# Patient Record
Sex: Male | Born: 1995 | Race: Black or African American | Hispanic: No | Marital: Single | Smoking: Never smoker
Health system: Southern US, Community
[De-identification: ages and names within clinical notes are randomized; demographics above are authoritative.]

## PROBLEM LIST (undated history)

## (undated) DIAGNOSIS — Z789 Other specified health status: Secondary | ICD-10-CM

## (undated) HISTORY — PX: NO PAST SURGERIES: SHX2092

---
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
## 2014-09-19 ENCOUNTER — Emergency Department (INDEPENDENT_AMBULATORY_CARE_PROVIDER_SITE_OTHER)
Admission: EM | Admit: 2014-09-19 | Discharge: 2014-09-19 | Disposition: A | Discharge status: Home / Self Care | Payer: Managed Care, Other (non HMO) | Source: Home / Self Care | Attending: Family Medicine | Admitting: Family Medicine

## 2014-09-19 ENCOUNTER — Encounter (HOSPITAL_COMMUNITY): Payer: Self-pay | Admitting: Emergency Medicine

## 2014-09-19 DIAGNOSIS — H6983 Other specified disorders of Eustachian tube, bilateral: Secondary | ICD-10-CM

## 2014-09-19 DIAGNOSIS — T700XXA Otitic barotrauma, initial encounter: Secondary | ICD-10-CM

## 2014-09-19 DIAGNOSIS — J029 Acute pharyngitis, unspecified: Secondary | ICD-10-CM

## 2014-09-19 LAB — POCT RAPID STREP A: Streptococcus, Group A Screen (Direct): NEGATIVE

## 2014-09-19 NOTE — ED Notes (Signed)
Sore throat and right ear pain, onset of symptoms 12/23

## 2014-09-19 NOTE — ED Provider Notes (Signed)
CSN: [id number]     Arrival date & time 09/19/14  1110 History   First MD Initiated Contact with Patient 09/19/14 1126     Chief Complaint  Patient presents with  . Sore Throat   (Consider location/radiation/quality/duration/timing/severity/associated sxs/prior Treatment) HPI Comments: C/O sore throat and right earache for 4 d.   History reviewed. No pertinent past medical history. History reviewed. No pertinent past surgical history. No family history on file. History  Substance Use Topics  . Smoking status: Never Smoker   . Smokeless tobacco: Not on file  . Alcohol Use: Yes    Review of Systems  Constitutional: Positive for activity change. Negative for fever.  HENT: Positive for congestion, postnasal drip, rhinorrhea and sore throat.   Respiratory: Positive for cough. Negative for shortness of breath.   Cardiovascular: Negative.   Gastrointestinal: Negative.     Allergies  Review of patient's allergies indicates no known allergies.  Home Medications   Prior to Admission medications   Medication Sig Start Date End Date Taking? Authorizing Provider  OVER THE COUNTER MEDICATION otc cough drops   Yes Historical Provider, MD   BP 117/76 mmHg  Pulse 74  Temp(Src) 98.7 F (37.1 C) (Oral)  Resp 16  SpO2 100% Physical Exam  Constitutional: He is oriented to person, place, and time. He appears well-developed and well-nourished. No distress.  HENT:  Mouth/Throat: No oropharyngeal exudate.  Bilat TM's retracted  OP with copious thick PND  Eyes: Conjunctivae and EOM are normal.  Neck: Normal range of motion. Neck supple.  Cardiovascular: Normal rate, regular rhythm and normal heart sounds.   Pulmonary/Chest: Effort normal and breath sounds normal.  Musculoskeletal: Normal range of motion.  Lymphadenopathy:    He has no cervical adenopathy.  Neurological: He is alert and oriented to person, place, and time.  Skin: Skin is warm and dry.  Psychiatric: He has a normal  mood and affect.  Nursing note and vitals reviewed.   ED Course  Procedures (including critical care time) Labs Review Labs Reviewed  POCT RAPID STREP A ([facility] ONLY)   Results for orders placed or performed during the hospital encounter of 09/19/14  POCT rapid strep A [facility])  Result Value Ref Range   Streptococcus, Group A Screen (Direct) NEGATIVE NEGATIVE    Imaging Review No results found.   MDM   1. Pharyngitis   2. Barotitis media, initial encounter   3. ETD (eustachian tube dysfunction), bilateral    Sudafed PE 10 mg  Robitussin Allegra or claritin for drainage Ibuprofen for pain Lots of fluids Nasal saline      Hayden Rasmussenavid Westin Knotts, NP 09/19/14 1148

## 2014-09-19 NOTE — Discharge Instructions (Signed)
Barotitis Media Sudafed PE 10 mg  Robitussin Allegra or claritin for drainage Ibuprofen for pain Lots of fluids Nasal saline Barotitis media is inflammation of your middle ear. This occurs when the auditory tube (eustachian tube) leading from the back of your nose (nasopharynx) to your eardrum is blocked. This blockage may result from a cold, environmental allergies, or an upper respiratory infection. Unresolved barotitis media may lead to damage or hearing loss (barotrauma), which may become permanent. HOME CARE INSTRUCTIONS   Use medicines as recommended by your health care provider. Over-the-counter medicines will help unblock the canal and can help during times of air travel.  Do not put anything into your ears to clean or unplug them. Eardrops will not be helpful.  Do not swim, dive, or fly until your health care provider says it is all right to do so. If these activities are necessary, chewing gum with frequent, forceful swallowing may help. It is also helpful to hold your nose and gently blow to pop your ears for equalizing pressure changes. This forces air into the eustachian tube.  Only take over-the-counter or prescription medicines for pain, discomfort, or fever as directed by your health care provider.  A decongestant may be helpful in decongesting the middle ear and make pressure equalization easier. SEEK MEDICAL CARE IF:  You experience a serious form of dizziness in which you feel as if the room is spinning and you feel nauseated (vertigo).  Your symptoms only involve one ear. SEEK IMMEDIATE MEDICAL CARE IF:   You develop a severe headache, dizziness, or severe ear pain.  You have bloody or pus-like drainage from your ears.  You develop a fever.  Your problems do not improve or become worse. MAKE SURE YOU:   Understand these instructions.  Will watch your condition.  Will get help right away if you are not doing well or get worse. Document Released: 09/07/2000  Document Revised: 07/01/2013 Document Reviewed: 04/07/2013 [facility] Patient Information 2015 [place], [place]. This information is not intended to replace advice given to you by your health care provider. Make sure you discuss any questions you have with your health care provider.  Sore Throat A sore throat is a painful, burning, sore, or scratchy feeling of the throat. There may be pain or tenderness when swallowing or talking. You may have other symptoms with a sore throat. These include coughing, sneezing, fever, or a swollen neck. A sore throat is often the first sign of another sickness. These sicknesses may include a cold, flu, strep throat, or an infection called mono. Most sore throats go away without medical treatment.  HOME CARE   Only take medicine as told by your doctor.  Drink enough fluids to keep your pee (urine) clear or pale yellow.  Rest as needed.  Try using throat sprays, lozenges, or suck on hard candy (if older than 4 years or as told).  Sip warm liquids, such as broth, herbal tea, or warm water with honey. Try sucking on frozen ice pops or drinking cold liquids.  Rinse the mouth (gargle) with salt water. Mix 1 teaspoon salt with 8 ounces of water.  Do not smoke. Avoid being around others when they are smoking.  Put a humidifier in your bedroom at night to moisten the air. You can also turn on a hot shower and sit in the bathroom for 5-10 minutes. Be sure the bathroom door is closed. GET HELP RIGHT AWAY IF:   You have trouble breathing.  You cannot swallow fluids,  soft foods, or your spit (saliva).  You have more puffiness (swelling) in the throat.  Your sore throat does not get better in 7 days.  You feel sick to your stomach (nauseous) and throw up (vomit).  You have a fever or lasting symptoms for more than 2-3 days.  You have a fever and your symptoms suddenly get worse. MAKE SURE YOU:   Understand these instructions.  Will watch your  condition.  Will get help right away if you are not doing well or get worse. Document Released: 06/19/2008 Document Revised: 06/04/2012 Document Reviewed: 05/18/2012 [facility] Patient Information 2015 [place], [place]. This information is not intended to replace advice given to you by your health care provider. Make sure you discuss any questions you have with your health care provider.  Upper Respiratory Infection, Adult An upper respiratory infection (URI) is also known as the common cold. It is often caused by a type of germ (virus). Colds are easily spread (contagious). You can pass it to others by kissing, coughing, sneezing, or drinking out of the same glass. Usually, you get better in 1 or 2 weeks.  HOME CARE   Only take medicine as told by your doctor.  Use a warm mist humidifier or breathe in steam from a hot shower.  Drink enough water and fluids to keep your pee (urine) clear or pale yellow.  Get plenty of rest.  Return to work when your temperature is back to normal or as told by your doctor. You may use a face mask and wash your hands to stop your cold from spreading. GET HELP RIGHT AWAY IF:   After the first few days, you feel you are getting worse.  You have questions about your medicine.  You have chills, shortness of breath, or brown or red spit (mucus).  You have yellow or brown snot (nasal discharge) or pain in the face, especially when you bend forward.  You have a fever, puffy (swollen) neck, pain when you swallow, or white spots in the back of your throat.  You have a bad headache, ear pain, sinus pain, or chest pain.  You have a high-pitched whistling sound when you breathe in and out (wheezing).  You have a lasting cough or cough up blood.  You have sore muscles or a stiff neck. MAKE SURE YOU:   Understand these instructions.  Will watch your condition.  Will get help right away if you are not doing well or get worse. Document Released: 02/27/2008  Document Revised: 12/03/2011 Document Reviewed: 12/16/2013 [facility] Patient Information 2015 [place], [place]. This information is not intended to replace advice given to you by your health care provider. Make sure you discuss any questions you have with your health care provider.

## 2014-09-21 LAB — CULTURE, GROUP A STREP

## 2020-06-27 NOTE — Progress Notes (Signed)
   06/28/2020 9:11 AM   Roc Ursin 07/04/1996 [id number]  Referring provider: No referring provider defined for this encounter. Chief Complaint  Patient presents with  . Circumcision    HPI: Jeremy Valenzuela is a 23 y.o. male who is seen today regarding discussion of circumcision.     The patient has been dealing with phimosis for years. He has reports pain at the tip of penis when retracting foreskin. During intercourse he feels like he will have a tear at the tip of his penis. He reports some cracking and bleeding after intercourse.  His foreskin is very redundant and at times difficult to retract due to the tightness.  He is interested in circumcision.  No risk factors including normal BMI, no diabetes.   PMH: No past medical history on file.  Surgical History: No past surgical history on file.  Home Medications:  Allergies as of 06/28/2020   No Known Allergies     Medication List       Accurate as of June 28, 2020  9:11 AM. If you have any questions, ask your nurse or doctor.        OVER THE COUNTER MEDICATION otc cough drops       Allergies: No Known Allergies  Family History: No family history on file.  Social History:  reports that he has never smoked. He does not have any smokeless tobacco history on file. He reports current alcohol use. He reports that he does not use drugs.   Physical Exam: BP 112/75 (BP Location: Left Arm, Patient Position: Sitting, Cuff Size: Normal)   Pulse 76   Ht 6' 1" (1.854 m)   Wt 152 lb (68.9 kg)   BMI 20.05 kg/m   Constitutional:  Alert and oriented, No acute distress. HEENT: [place] AT, moist mucus membranes.  Trachea midline, no masses. Cardiovascular: No clubbing, cyanosis, or edema. Respiratory: Normal respiratory effort, no increased work of breathing. GI: Abdomen is soft, nontender, nondistended, no abdominal masses GU: Uncircumcised phallus with tight foreskin, ultimately able to retract but a discrete band  just behind his coronal margin.  Normal glans with orthotopic patent meatus.  Normal bilateral descended testicles. Skin: No rashes, bruises or suspicious lesions. Neurologic: Grossly intact, no focal deficits, moving all 4 extremities. Psychiatric: Normal mood and affect.   Assessment & Plan:    1. Phimosis  I educated patient about circumcision procedure along with the risk of bleeding, dissatisfaction with cosmesis infection, wound healing, hematoma, sensitivity at the head of the penis, post op care and symptoms were all discussed in detail.     Patient is agreeable with plan.  [facility] [address], [address] [place], [place] [address] [phone]  I, Alexis Patterson, am acting as a scribe for Dr. Eytan Carrigan.  I have reviewed the above documentation for accuracy and completeness, and I agree with the above.   Therma Lasure, MD     

## 2020-06-27 NOTE — H&P (View-Only) (Signed)
   06/28/2020 9:11 AM   Jeremy Valenzuela 1996-07-13 [id number]  Referring provider: No referring provider defined for this encounter. Chief Complaint  Patient presents with  . Circumcision    HPI: Jeremy Valenzuela is a 24 y.o. male who is seen today regarding discussion of circumcision.     The patient has been dealing with phimosis for years. He has reports pain at the tip of penis when retracting foreskin. During intercourse he feels like he will have a tear at the tip of his penis. He reports some cracking and bleeding after intercourse.  His foreskin is very redundant and at times difficult to retract due to the tightness.  He is interested in circumcision.  No risk factors including normal BMI, no diabetes.   PMH: No past medical history on file.  Surgical History: No past surgical history on file.  Home Medications:  Allergies as of 06/28/2020   No Known Allergies     Medication List       Accurate as of June 28, 2020  9:11 AM. If you have any questions, ask your nurse or doctor.        OVER THE COUNTER MEDICATION otc cough drops       Allergies: No Known Allergies  Family History: No family history on file.  Social History:  reports that he has never smoked. He does not have any smokeless tobacco history on file. He reports current alcohol use. He reports that he does not use drugs.   Physical Exam: BP 112/75 (BP Location: Left Arm, Patient Position: Sitting, Cuff Size: Normal)   Pulse 76   Ht 6\' 1"  (1.854 m)   Wt 152 lb (68.9 kg)   BMI 20.05 kg/m   Constitutional:  Alert and oriented, No acute distress. HEENT: [place] AT, moist mucus membranes.  Trachea midline, no masses. Cardiovascular: No clubbing, cyanosis, or edema. Respiratory: Normal respiratory effort, no increased work of breathing. GI: Abdomen is soft, nontender, nondistended, no abdominal masses GU: Uncircumcised phallus with tight foreskin, ultimately able to retract but a discrete band  just behind his coronal margin.  Normal glans with orthotopic patent meatus.  Normal bilateral descended testicles. Skin: No rashes, bruises or suspicious lesions. Neurologic: Grossly intact, no focal deficits, moving all 4 extremities. Psychiatric: Normal mood and affect.   Assessment & Plan:    1. Phimosis  I educated patient about circumcision procedure along with the risk of bleeding, dissatisfaction with cosmesis infection, wound healing, hematoma, sensitivity at the head of the penis, post op care and symptoms were all discussed in detail.     Patient is agreeable with plan.  [facility] [address] [place], [place] [id number]  I, [phone], am acting as a scribe for Dr. Theador Hawthorne.  I have reviewed the above documentation for accuracy and completeness, and I agree with the above.   Vanna Scotland, MD

## 2020-06-28 ENCOUNTER — Ambulatory Visit (INDEPENDENT_AMBULATORY_CARE_PROVIDER_SITE_OTHER): Payer: Managed Care, Other (non HMO) | Admitting: Urology

## 2020-06-28 ENCOUNTER — Other Ambulatory Visit: Payer: Self-pay

## 2020-06-28 VITALS — BP 112/75 | HR 76 | Ht 73.0 in | Wt 152.0 lb

## 2020-06-28 DIAGNOSIS — N471 Phimosis: Secondary | ICD-10-CM

## 2020-07-01 ENCOUNTER — Other Ambulatory Visit: Payer: Self-pay | Admitting: Radiology

## 2020-07-04 ENCOUNTER — Other Ambulatory Visit: Payer: Self-pay | Admitting: Urology

## 2020-07-04 ENCOUNTER — Encounter
Admission: RE | Admit: 2020-07-04 | Discharge: 2020-07-04 | Disposition: A | Discharge status: Home / Self Care | Payer: Managed Care, Other (non HMO) | Source: Ambulatory Visit | Attending: Urology | Admitting: Urology

## 2020-07-04 ENCOUNTER — Other Ambulatory Visit: Payer: Self-pay | Admitting: Radiology

## 2020-07-04 ENCOUNTER — Other Ambulatory Visit: Payer: Self-pay

## 2020-07-04 DIAGNOSIS — N471 Phimosis: Secondary | ICD-10-CM

## 2020-07-04 HISTORY — DX: Other specified health status: Z78.9

## 2020-07-04 NOTE — Patient Instructions (Signed)
Your procedure is scheduled on: Thursday 07/07/20.  Report to DAY SURGERY DEPARTMENT LOCATED ON 2ND FLOOR MEDICAL MALL ENTRANCE. To find out your arrival time please call [phone] between 1PM - 3PM on Wednesday 07/06/20.   Remember: Instructions that are not followed completely may result in serious medical risk, up to and including death, or upon the discretion of your surgeon and anesthesiologist your surgery may need to be rescheduled.     __X__ 1. Do not eat food after midnight the night before your procedure.                 No gum chewing or hard candies. You may drink clear liquids up to 2 hours                 before you are scheduled to arrive for your surgery- DO NOT drink clear                 liquids within 2 hours of the start of your surgery.                 Clear Liquids include:  water, apple juice without pulp, clear carbohydrate                 drink such as Clearfast or Gatorade, Black Coffee or Tea (Do not add                 milk or creamer to coffee or tea).  __X__2.  On the morning of surgery brush your teeth with toothpaste and water, you may rinse your mouth with mouthwash if you wish.  Do not swallow any toothpaste or mouthwash.    __X__ 3.  No Alcohol for 24 hours before or after surgery.  __X__ 4.  Do Not Smoke or use e-cigarettes For 24 Hours Prior to Your Surgery.                 Do not use any chewable tobacco products for at least 6 hours prior to                 surgery.  __X__5.  Notify your doctor if there is any change in your medical condition      (cold, fever, infections).      Do NOT wear jewelry, make-up, hairpins, clips or nail polish. Do NOT wear lotions, powders, or perfumes.  Do NOT shave 48 hours prior to surgery. Men may shave face and neck. Do NOT bring valuables to the hospital.     [facility] is not responsible for any belongings or valuables.   Contacts, dentures/partials or body piercings may not be worn into surgery.  Bring a case for your contacts, glasses or hearing aids, a denture cup will be supplied.   Patients discharged the day of surgery will not be allowed to drive home.     __X__ Take these medicines the morning of surgery with A SIP OF WATER:     1. NONE    __X__ Shower before your arrival.  __X__ Stop Anti-inflammatories 7 days before surgery such as Advil, Ibuprofen, Motrin, BC or Goodies Powder, Naprosyn, Naproxen, Aleve, Aspirin, Meloxicam. May take Tylenol if needed for pain or discomfort.   __X__Do not start taking any new herbal supplements or vitamins prior to your procedure.    Wear comfortable clothing (specific to your surgery type) to the hospital.  Plan for stool softeners for home use; pain medications have a tendency to cause  constipation. You can also help prevent constipation by eating foods high in fiber such as fruits and vegetables and drinking plenty of fluids as your diet allows.  After surgery, you can prevent lung complications by doing breathing exercises.Take deep breaths and cough every 1-2 hours. Your doctor may order a device called an Incentive Spirometer to help you take deep breaths.  Please call the Pre-Admissions Testing Department at [phone] if you have any questions about these instructions.

## 2020-07-05 ENCOUNTER — Other Ambulatory Visit
Admission: RE | Admit: 2020-07-05 | Discharge: 2020-07-05 | Disposition: A | Discharge status: Home / Self Care | Payer: Managed Care, Other (non HMO) | Source: Ambulatory Visit | Attending: Urology | Admitting: Urology

## 2020-07-05 DIAGNOSIS — Z20822 Contact with and (suspected) exposure to covid-19: Secondary | ICD-10-CM | POA: Insufficient documentation

## 2020-07-05 DIAGNOSIS — Z01812 Encounter for preprocedural laboratory examination: Secondary | ICD-10-CM | POA: Insufficient documentation

## 2020-07-06 LAB — SARS CORONAVIRUS 2 (TAT 6-24 HRS): SARS Coronavirus 2: NEGATIVE

## 2020-07-07 ENCOUNTER — Ambulatory Visit: Payer: Managed Care, Other (non HMO) | Admitting: Anesthesiology

## 2020-07-07 ENCOUNTER — Ambulatory Visit
Admission: RE | Admit: 2020-07-07 | Discharge: 2020-07-07 | Disposition: A | Discharge status: Home / Self Care | Payer: Managed Care, Other (non HMO) | Attending: Urology | Admitting: Urology

## 2020-07-07 ENCOUNTER — Encounter: Payer: Self-pay | Admitting: Urology

## 2020-07-07 ENCOUNTER — Encounter
Admission: RE | Disposition: A | Discharge status: Home / Self Care | Payer: Self-pay | Source: Home / Self Care | Attending: Urology

## 2020-07-07 ENCOUNTER — Other Ambulatory Visit: Payer: Self-pay

## 2020-07-07 DIAGNOSIS — Q558 Other specified congenital malformations of male genital organs: Secondary | ICD-10-CM | POA: Insufficient documentation

## 2020-07-07 DIAGNOSIS — N471 Phimosis: Secondary | ICD-10-CM | POA: Insufficient documentation

## 2020-07-07 HISTORY — PX: CIRCUMCISION: SHX1350

## 2020-07-07 SURGERY — CIRCUMCISION, ADULT
Anesthesia: General

## 2020-07-07 MED ORDER — PROPOFOL 10 MG/ML IV BOLUS
INTRAVENOUS | Status: DC | PRN
  Administered 2020-07-07: 150 mg via INTRAVENOUS

## 2020-07-07 MED ORDER — EPHEDRINE 5 MG/ML INJ
INTRAVENOUS | Status: AC
  Filled 2020-07-07: qty 10

## 2020-07-07 MED ORDER — MIDAZOLAM HCL 2 MG/2ML IJ SOLN
INTRAMUSCULAR | Status: AC
  Filled 2020-07-07: qty 2

## 2020-07-07 MED ORDER — DEXAMETHASONE SODIUM PHOSPHATE 10 MG/ML IJ SOLN
INTRAMUSCULAR | Status: DC | PRN
  Administered 2020-07-07: 10 mg via INTRAVENOUS

## 2020-07-07 MED ORDER — ONDANSETRON HCL 4 MG/2ML IJ SOLN
INTRAMUSCULAR | Status: AC
  Filled 2020-07-07: qty 2

## 2020-07-07 MED ORDER — LACTATED RINGERS IV SOLN: INTRAVENOUS | Status: DC

## 2020-07-07 MED ORDER — DEXAMETHASONE SODIUM PHOSPHATE 10 MG/ML IJ SOLN
INTRAMUSCULAR | Status: AC
  Filled 2020-07-07: qty 1

## 2020-07-07 MED ORDER — FENTANYL CITRATE (PF) 100 MCG/2ML IJ SOLN: 25.0000 ug | INTRAMUSCULAR | Status: DC | PRN

## 2020-07-07 MED ORDER — EPHEDRINE SULFATE 50 MG/ML IJ SOLN
INTRAMUSCULAR | Status: DC | PRN
  Administered 2020-07-07 (×2): 10 mg via INTRAVENOUS

## 2020-07-07 MED ORDER — CHLORHEXIDINE GLUCONATE 0.12 % MT SOLN: 15.0000 mL | Freq: Once | OROMUCOSAL | Status: AC

## 2020-07-07 MED ORDER — LIDOCAINE HCL (PF) 1 % IJ SOLN
INTRAMUSCULAR | Status: AC
  Filled 2020-07-07: qty 30

## 2020-07-07 MED ORDER — FENTANYL CITRATE (PF) 100 MCG/2ML IJ SOLN
INTRAMUSCULAR | Status: DC | PRN
Start: 2020-07-07 — End: 2020-07-07
  Administered 2020-07-07 (×2): 25 ug via INTRAVENOUS

## 2020-07-07 MED ORDER — BACITRACIN ZINC 500 UNIT/GM EX OINT
TOPICAL_OINTMENT | CUTANEOUS | Status: DC | PRN
  Administered 2020-07-07: 1 via TOPICAL

## 2020-07-07 MED ORDER — SEVOFLURANE IN SOLN
RESPIRATORY_TRACT | Status: AC
  Filled 2020-07-07: qty 250

## 2020-07-07 MED ORDER — FAMOTIDINE 20 MG PO TABS
ORAL_TABLET | ORAL | Status: AC
  Administered 2020-07-07: 20 mg via ORAL
  Filled 2020-07-07: qty 1

## 2020-07-07 MED ORDER — LIDOCAINE HCL (PF) 2 % IJ SOLN
INTRAMUSCULAR | Status: AC
  Filled 2020-07-07: qty 5

## 2020-07-07 MED ORDER — FENTANYL CITRATE (PF) 100 MCG/2ML IJ SOLN
INTRAMUSCULAR | Status: AC
  Filled 2020-07-07: qty 2

## 2020-07-07 MED ORDER — FAMOTIDINE 20 MG PO TABS: 20.0000 mg | ORAL_TABLET | Freq: Once | ORAL | Status: AC

## 2020-07-07 MED ORDER — CEFAZOLIN SODIUM-DEXTROSE 1-4 GM/50ML-% IV SOLN
1.0000 g | INTRAVENOUS | Status: AC
  Administered 2020-07-07: 1 g via INTRAVENOUS

## 2020-07-07 MED ORDER — LIDOCAINE HCL (CARDIAC) PF 100 MG/5ML IV SOSY
PREFILLED_SYRINGE | INTRAVENOUS | Status: DC | PRN
  Administered 2020-07-07: 60 mg via INTRAVENOUS

## 2020-07-07 MED ORDER — MIDAZOLAM HCL 2 MG/2ML IJ SOLN
INTRAMUSCULAR | Status: DC | PRN
  Administered 2020-07-07: 2 mg via INTRAVENOUS

## 2020-07-07 MED ORDER — CHLORHEXIDINE GLUCONATE 0.12 % MT SOLN
OROMUCOSAL | Status: AC
  Administered 2020-07-07: 15 mL via OROMUCOSAL
  Filled 2020-07-07: qty 15

## 2020-07-07 MED ORDER — DOCUSATE SODIUM 100 MG PO CAPS: 100.0000 mg | ORAL_CAPSULE | Freq: Two times a day (BID) | ORAL | 0 refills | Status: AC

## 2020-07-07 MED ORDER — LIDOCAINE 1% INJECTION FOR CIRCUMCISION
INJECTION | INTRAVENOUS | Status: DC | PRN
  Administered 2020-07-07: 20 mL via SUBCUTANEOUS

## 2020-07-07 MED ORDER — OXYCODONE-ACETAMINOPHEN 5-325 MG PO TABS
1.0000 | ORAL_TABLET | ORAL | 0 refills | Status: AC | PRN
Start: 2020-07-07 — End: ?

## 2020-07-07 MED ORDER — PROMETHAZINE HCL 25 MG/ML IJ SOLN: 6.2500 mg | INTRAMUSCULAR | Status: DC | PRN

## 2020-07-07 MED ORDER — PROPOFOL 10 MG/ML IV BOLUS
INTRAVENOUS | Status: AC
  Filled 2020-07-07: qty 20

## 2020-07-07 MED ORDER — ORAL CARE MOUTH RINSE: 15.0000 mL | Freq: Once | OROMUCOSAL | Status: AC

## 2020-07-07 MED ORDER — CEFAZOLIN SODIUM-DEXTROSE 2-4 GM/100ML-% IV SOLN
INTRAVENOUS | Status: AC
  Filled 2020-07-07: qty 100

## 2020-07-07 SURGICAL SUPPLY — 26 items
APL PRP STRL LF DISP 70% ISPRP (MISCELLANEOUS) ×2
BLADE CLIPPER SURG (BLADE) ×3 IMPLANT
BLADE SURG 15 STRL LF DISP TIS (BLADE) ×1 IMPLANT
BLADE SURG 15 STRL SS (BLADE) ×3
BNDG COHESIVE 1X5 TAN NS LF (GAUZE/BANDAGES/DRESSINGS) ×3 IMPLANT
BNDG CONFORM 2 STRL LF (GAUZE/BANDAGES/DRESSINGS) ×3 IMPLANT
CANISTER SUCT 1200ML W/VALVE (MISCELLANEOUS) ×3 IMPLANT
CHLORAPREP W/TINT 26 (MISCELLANEOUS) ×6 IMPLANT
COVER WAND RF STERILE (DRAPES) ×3 IMPLANT
DRAPE LAPAROTOMY 77X122 PED (DRAPES) ×3 IMPLANT
ELECT REM PT RETURN 9FT ADLT (ELECTROSURGICAL) ×3
ELECTRODE REM PT RTRN 9FT ADLT (ELECTROSURGICAL) ×1 IMPLANT
GAUZE PETROLATUM 1 X8 (GAUZE/BANDAGES/DRESSINGS) ×3 IMPLANT
GLOVE BIO SURGEON STRL SZ 6.5 (GLOVE) ×8 IMPLANT
GLOVE BIO SURGEONS STRL SZ 6.5 (GLOVE) ×4
GOWN STRL REUS W/ TWL LRG LVL3 (GOWN DISPOSABLE) ×2 IMPLANT
GOWN STRL REUS W/TWL LRG LVL3 (GOWN DISPOSABLE) ×6
KIT TURNOVER KIT A (KITS) ×3 IMPLANT
LABEL OR SOLS (LABEL) ×3 IMPLANT
NEEDLE HYPO 25X1 1.5 SAFETY (NEEDLE) ×3 IMPLANT
NS IRRIG 500ML POUR BTL (IV SOLUTION) ×3 IMPLANT
PACK BASIN MINOR (MISCELLANEOUS) ×3 IMPLANT
SOL PREP PVP 2OZ (MISCELLANEOUS) ×3
SOLUTION PREP PVP 2OZ (MISCELLANEOUS) ×1 IMPLANT
SUT CHROMIC 3 0 SH 27 (SUTURE) ×6 IMPLANT
SYR 10ML LL (SYRINGE) ×3 IMPLANT

## 2020-07-07 NOTE — Op Note (Signed)
Date of procedure: 07/07/20  Preoperative diagnosis:  1. Phimosis 2. Frenular adhesion  Postoperative diagnosis:  1. Same as above  Procedure: 1. Circumcision       2.   Lysis of frenular adhesions/frenulectomy   Surgeon: Vanna Scotland, MD  Anesthesia: General  Complications: None  Intraoperative findings: Fairly significant phimosis with tight circumferential band of the foreskin.  Frenular attachments taken down sharply and oversewn.  Excellent post procedure cosmesis.  EBL: Minimal  Specimens: None  Drains: None  Indication: Jeremy Valenzuela is a 24 y.o. patient with bothersome phimosis.  After reviewing the management options for treatment, he elected to proceed with the above surgical procedure(s). We have discussed the potential benefits and risks of the procedure, side effects of the proposed treatment, the likelihood of the patient achieving the goals of the procedure, and any potential problems that might occur during the procedure or recuperation. Informed consent has been obtained.  Description of procedure:  The patient was taken to the operating room and general anesthesia was induced.  The patient was placed in the supine position, prepped and draped in the usual sterile fashion, and preoperative antibiotics were administered. A preoperative time-out was performed.   A penile block was performed injecting 10 cc chest superior to the base of the penis and Alcock's canal bilaterally.  An additional 10 cc of 1% lidocaine was used circumferentially around the base of the penis to perform a ring block.  Next, 2 circumferential lines were drawn, all 1 at the level of the midshaft and 1 approximately 1 cm proximal to the coronal margin.  Notably, upon reducing the foreskin, there was a very tight band of the foreskin which was somewhat difficult to retract.  I used additional Betadine solution to cleanse the glans which had not been previously fully exposed due to the tightness  of the foreskin.  Additional very dense attachments of the frenulum were noted forming a penile band at this location.  I crush this tissue using a small clamp and then used Metzenbaum scissors to take down these attachments.  The frenulum was then reconstructed using a series of interrupted 3-0 chromic sutures for hemostasis.  The cosmesis was excellent as well as hemostasis.  Next, the penile skin was incised at the planned marked sites.  The foreskin was then removed in a sleevelike fashion.  Adequate careful hemostasis was then achieved using Bovie electrocautery.  The foreskin was then reapproximated using running 3-0 chromic suture in interrupted fashion.  In the end, hemostasis and cosmesis was excellent.  The patient was then cleaned and dried.  A dressing of Vaseline gauze conformer and then Coban was applied.  Additional bacitracin bacitracin ointment was applied at the frenulum and on the glans.  He was then cleaned and dried, repositioned supine position, reversed myesthesia, taken to PACU in stable condition.  Plan: He will keep his bandage for 2 days.  We will have him follow-up in 4 weeks for wound check.  Vanna Scotland, M.D.

## 2020-07-07 NOTE — Anesthesia Procedure Notes (Signed)
Procedure Name: LMA Insertion Date/Time: 07/07/2020 12:34 PM Performed by: Omer Jack, CRNA Pre-anesthesia Checklist: Patient identified, Patient being monitored, Timeout performed, Emergency Drugs available and Suction available Patient Re-evaluated:Patient Re-evaluated prior to induction Oxygen Delivery Method: Circle system utilized Preoxygenation: Pre-oxygenation with 100% oxygen Induction Type: IV induction Ventilation: Mask ventilation without difficulty LMA: LMA inserted LMA Size: 4.0 Tube type: Oral Number of attempts: 1 Placement Confirmation: positive ETCO2 and breath sounds checked- equal and bilateral Tube secured with: Tape Dental Injury: Teeth and Oropharynx as per pre-operative assessment

## 2020-07-07 NOTE — Interval H&P Note (Signed)
History and Physical Interval Note:  07/07/2020 11:57 AM  [name]  has presented today for surgery, with the diagnosis of Phimosis.  The various methods of treatment have been discussed with the patient and family. After consideration of risks, benefits and other options for treatment, the patient has consented to  Procedure(s): CIRCUMCISION ADULT (N/A) as a surgical intervention.  The patient's history has been reviewed, patient examined, no change in status, stable for surgery.  I have reviewed the patient's chart and labs.  Questions were answered to the patient's satisfaction.    RR[facility] CTAB  Jeremy Valenzuela

## 2020-07-07 NOTE — Anesthesia Postprocedure Evaluation (Signed)
Anesthesia Post Note  Patient: Jeremy Valenzuela  Procedure(s) Performed: CIRCUMCISION ADULT (N/A )  Patient location during evaluation: PACU Anesthesia Type: General Level of consciousness: awake and alert Pain management: pain level controlled Vital Signs Assessment: post-procedure vital signs reviewed and stable Respiratory status: spontaneous breathing, nonlabored ventilation, respiratory function stable and patient connected to nasal cannula oxygen Cardiovascular status: blood pressure returned to baseline and stable Postop Assessment: no apparent nausea or vomiting Anesthetic complications: no   No complications documented.   Last Vitals:  Vitals:   07/07/20 1410 07/07/20 1431  BP: 121/78 (!) 102/52  Pulse: 98 70  Resp: 18 18  Temp: 36.4 C (!) 35.7 C  SpO2: 100% 100%    Last Pain:  Vitals:   07/07/20 1431  TempSrc: Tympanic  PainSc: 0-No pain                 Lenard Simmer

## 2020-07-07 NOTE — Anesthesia Preprocedure Evaluation (Signed)
Anesthesia Evaluation  Patient identified by MRN, date of birth, ID band Patient awake    Reviewed: Allergy & Precautions, H&P , NPO status , Patient's Chart, lab work & pertinent test results, reviewed documented beta blocker date and time   History of Anesthesia Complications Negative for: history of anesthetic complications  Airway Mallampati: II  TM Distance: >3 FB Neck ROM: full    Dental  (+) Dental Advidsory Given, Teeth Intact   Pulmonary neg pulmonary ROS,    Pulmonary exam normal breath sounds clear to auscultation       Cardiovascular Exercise Tolerance: Good negative cardio ROS Normal cardiovascular exam Rhythm:regular Rate:Normal     Neuro/Psych negative neurological ROS  negative psych ROS   GI/Hepatic negative GI ROS, Neg liver ROS,   Endo/Other  negative endocrine ROS  Renal/GU negative Renal ROS  negative genitourinary   Musculoskeletal   Abdominal   Peds  Hematology negative hematology ROS (+)   Anesthesia Other Findings Past Medical History: No date: Medical history non-contributory   Reproductive/Obstetrics negative OB ROS                             Anesthesia Physical Anesthesia Plan  ASA: I  Anesthesia Plan: General   Post-op Pain Management:    Induction: Intravenous  PONV Risk Score and Plan: 2 and Ondansetron, Dexamethasone, Midazolam, Promethazine and Treatment may vary due to age or medical condition  Airway Management Planned: LMA  Additional Equipment:   Intra-op Plan:   Post-operative Plan: Extubation in OR  Informed Consent: I have reviewed the patients History and Physical, chart, labs and discussed the procedure including the risks, benefits and alternatives for the proposed anesthesia with the patient or authorized representative who has indicated his/her understanding and acceptance.     Dental Advisory Given  Plan Discussed with:  Anesthesiologist, CRNA and Surgeon  Anesthesia Plan Comments:         Anesthesia Quick Evaluation

## 2020-07-07 NOTE — Transfer of Care (Signed)
Immediate Anesthesia Transfer of Care Note  Patient: Jeremy Valenzuela  Procedure(s) Performed: CIRCUMCISION ADULT (N/A )  Patient Location: PACU  Anesthesia Type:General  Level of Consciousness: awake and sedated  Airway & Oxygen Therapy: Patient Spontanous Breathing and Patient connected to face mask oxygen  Post-op Assessment: Report given to RN and Post -op Vital signs reviewed and stable  Post vital signs: Reviewed and stable  Last Vitals:  Vitals Value Taken Time  BP 114/73 07/07/20 1342  Temp    Pulse 68 07/07/20 1343  Resp 18 07/07/20 1343  SpO2 100 % 07/07/20 1343  Vitals shown include unvalidated device data.  Last Pain:  Vitals:   07/07/20 1059  TempSrc: Temporal  PainSc: 0-No pain         Complications: No complications documented.

## 2020-07-07 NOTE — Discharge Instructions (Signed)
Circumcision, Adult, Care After This sheet gives you information about how to care for yourself after your procedure. Your doctor may also give you more specific instructions. If you have problems or questions, contact your doctor. Follow these instructions at home: Cut care   Follow instructions from your doctor about how to take care of your cut from surgery (incision). Make sure you: ? Wash your hands with soap and water before you change your bandage (dressing). If you cannot use soap and water, use hand sanitizer. ? Change your bandage as told by your doctor. ? Leave stitches (sutures), skin glue, or skin tape (adhesive) strips in place. They may need to stay in place for 2 weeks or longer. If tape strips get loose and curl up, you may trim the loose edges. Do not remove tape strips completely unless your doctor says it is okay  Check your cut area every day for signs of infection. Check for: ? More redness, swelling, or pain. ? More fluid or blood. ? Warmth. ? Pus or a bad smell. Bathing  Do not take baths, swim, or use a hot tub until your doctor says it is okay. Ask your doctor if you can take showers. You may only be allowed to take sponge baths for bathing.  Do not get your cut area wet during the 24 hours after the procedure, or as long as told by your doctor.  When you can shower, do not rub the cut area. Gently pat it dry. General instructions  Avoid heavy lifting, contact sports, biking, or swimming until your doctor says it is okay.  Do not have sex until your doctor says it is okay.  Take or apply over-the-counter and prescription medicines only as told by your doctor.  Keep all follow-up visits as told by your doctor. This is important. Contact a doctor if:  Medicine does not help your pain.  You have more redness, swelling, or pain around your cut.  You have more fluid or blood coming from your cut.  Your cut feels warm to the touch.  You have pus or a bad  smell coming from your cut.  You have a fever. Get help right away if:  You cannot pee (urinate).  It hurts to pee.  Your pain is not helped by medicines.  There is redness, swelling, and soreness that spreads up the shaft of your penis, your thighs, or your lower belly (abdomen).  You have bleeding that does not stop when you press on it. Summary  Follow instructions from your doctor about how to take care of your cut from surgery (incision).  Check your cut area every day for signs of infection.  Do not have sex until your doctor says it is okay. This information is not intended to replace advice given to you by your health care provider. Make sure you discuss any questions you have with your health care provider. Document Revised: 08/23/2017 Document Reviewed: 09/18/2016 Elsevier Patient Education  2020 [employer]   Dr. Apolinar Junes specific instructions:  1.  You may remove your bandage in 48 hours.  There is a blue marking on the wrap to start unraveling the bandage.  If you cannot urinate, you may have to remove it earlier.  2.  You may shower after the bandages removed.  3.  Follow the above directions.  AMBULATORY SURGERY  DISCHARGE INSTRUCTIONS   1) The drugs that you were given will stay in your system until tomorrow so for the next  24 hours you should not:  A) Drive an automobile B) Make any legal decisions C) Drink any alcoholic beverage   2) You may resume regular meals tomorrow.  Today it is better to start with liquids and gradually work up to solid foods.  You may eat anything you prefer, but it is better to start with liquids, then soup and crackers, and gradually work up to solid foods.   3) Please notify your doctor immediately if you have any unusual bleeding, trouble breathing, redness and pain at the surgery site, drainage, fever, or pain not relieved by medication.    4) Additional Instructions:        Please contact your physician  with any problems or Same Day Surgery at [phone], Monday through Friday 6 am to 4 pm, or [place] at [facility] number at [phone].

## 2020-07-08 ENCOUNTER — Encounter: Payer: Self-pay | Admitting: Urology

## 2020-07-11 ENCOUNTER — Encounter: Payer: Self-pay | Admitting: Urology
